# Patient Record
Sex: Male | Born: 1970 | Race: White | Hispanic: No | Smoking: Current every day smoker
Health system: Southern US, Community
[De-identification: ages and names within clinical notes are randomized; demographics above are authoritative.]

## PROBLEM LIST (undated history)

## (undated) DIAGNOSIS — W3301XA Accidental discharge of shotgun, initial encounter: Secondary | ICD-10-CM

## (undated) DIAGNOSIS — I219 Acute myocardial infarction, unspecified: Secondary | ICD-10-CM

## (undated) DIAGNOSIS — T148XXA Other injury of unspecified body region, initial encounter: Secondary | ICD-10-CM

## (undated) DIAGNOSIS — E669 Obesity, unspecified: Secondary | ICD-10-CM

## (undated) DIAGNOSIS — I2510 Atherosclerotic heart disease of native coronary artery without angina pectoris: Secondary | ICD-10-CM

## (undated) DIAGNOSIS — F1010 Alcohol abuse, uncomplicated: Secondary | ICD-10-CM

## (undated) HISTORY — DX: Alcohol abuse, uncomplicated: F10.10

## (undated) HISTORY — PX: CORONARY STENT PLACEMENT: SHX1402

## (undated) HISTORY — DX: Obesity, unspecified: E66.9

## (undated) HISTORY — DX: Other injury of unspecified body region, initial encounter: T14.8XXA

## (undated) HISTORY — DX: Hypomagnesemia: E83.42

## (undated) HISTORY — DX: Accidental discharge of shotgun, initial encounter: W33.01XA

---

## 2005-02-18 ENCOUNTER — Emergency Department: Payer: Self-pay | Admitting: General Practice

## 2007-10-16 ENCOUNTER — Emergency Department: Payer: Self-pay | Admitting: Emergency Medicine

## 2008-09-09 ENCOUNTER — Emergency Department: Payer: Self-pay | Admitting: Emergency Medicine

## 2009-01-03 ENCOUNTER — Emergency Department: Payer: Self-pay | Admitting: Unknown Physician Specialty

## 2009-01-04 ENCOUNTER — Encounter: Payer: Self-pay | Admitting: Internal Medicine

## 2009-01-04 ENCOUNTER — Ambulatory Visit: Payer: Self-pay | Admitting: Internal Medicine

## 2009-01-04 DIAGNOSIS — F17200 Nicotine dependence, unspecified, uncomplicated: Secondary | ICD-10-CM

## 2009-01-04 DIAGNOSIS — R5381 Other malaise: Secondary | ICD-10-CM | POA: Insufficient documentation

## 2009-01-04 DIAGNOSIS — R5383 Other fatigue: Secondary | ICD-10-CM

## 2009-01-04 DIAGNOSIS — I4891 Unspecified atrial fibrillation: Secondary | ICD-10-CM

## 2009-01-04 DIAGNOSIS — F1010 Alcohol abuse, uncomplicated: Secondary | ICD-10-CM | POA: Insufficient documentation

## 2009-01-23 ENCOUNTER — Ambulatory Visit: Payer: Self-pay

## 2009-01-23 ENCOUNTER — Encounter: Payer: Self-pay | Admitting: Internal Medicine

## 2009-02-15 ENCOUNTER — Ambulatory Visit: Payer: Self-pay | Admitting: Orthopedic Surgery

## 2009-02-16 ENCOUNTER — Ambulatory Visit: Payer: Self-pay | Admitting: Internal Medicine

## 2009-02-20 ENCOUNTER — Inpatient Hospital Stay: Payer: Self-pay | Admitting: Orthopedic Surgery

## 2009-05-25 ENCOUNTER — Ambulatory Visit: Payer: Self-pay | Admitting: Internal Medicine

## 2009-07-27 ENCOUNTER — Ambulatory Visit: Payer: Self-pay | Admitting: Orthopedic Surgery

## 2009-08-02 ENCOUNTER — Ambulatory Visit: Payer: Self-pay | Admitting: Orthopedic Surgery

## 2009-11-22 ENCOUNTER — Ambulatory Visit: Payer: Self-pay | Admitting: Orthopedic Surgery

## 2010-02-24 ENCOUNTER — Emergency Department: Payer: Self-pay | Admitting: Emergency Medicine

## 2010-03-08 ENCOUNTER — Ambulatory Visit: Payer: Self-pay

## 2010-05-31 ENCOUNTER — Ambulatory Visit: Payer: Self-pay

## 2011-04-07 ENCOUNTER — Encounter: Payer: Self-pay | Admitting: Cardiovascular Disease

## 2011-11-12 ENCOUNTER — Inpatient Hospital Stay: Payer: Self-pay | Admitting: Internal Medicine

## 2011-11-12 LAB — COMPREHENSIVE METABOLIC PANEL
Albumin: 4.1 g/dL (ref 3.4–5.0)
Alkaline Phosphatase: 79 U/L (ref 50–136)
BUN: 7 mg/dL (ref 7–18)
EGFR (Non-African Amer.): >60
Glucose: 115 mg/dL — ABNORMAL HIGH (ref 65–99)
SGOT(AST): 83 U/L — ABNORMAL HIGH (ref 15–37)
SGPT (ALT): 105 U/L — ABNORMAL HIGH

## 2011-11-12 LAB — CK TOTAL AND CKMB (NOT AT ARMC)
CK, Total: 236 U/L — ABNORMAL HIGH (ref 35–232)
CK, Total: 688 U/L — ABNORMAL HIGH (ref 35–232)
CK-MB: 39.3 ng/mL — ABNORMAL HIGH (ref 0.5–3.6)

## 2011-11-12 LAB — CBC
HCT: 43.1 % (ref 40.0–52.0)
HGB: 14.6 g/dL (ref 13.0–18.0)
MCV: 95 fL (ref 80–100)
Platelet: 143 10*3/uL — ABNORMAL LOW (ref 150–440)
RDW: 13.5 % (ref 11.5–14.5)

## 2011-11-12 LAB — PROTIME-INR
INR: 1
Prothrombin Time: 13.2 secs (ref 11.5–14.7)

## 2011-11-12 LAB — TROPONIN I: Troponin-I: 18 ng/mL — ABNORMAL HIGH

## 2011-11-13 LAB — BASIC METABOLIC PANEL
Anion Gap: 14 (ref 7–16)
Calcium, Total: 8.1 mg/dL — ABNORMAL LOW (ref 8.5–10.1)
EGFR (African American): >60
EGFR (Non-African Amer.): >60
Glucose: 90 mg/dL (ref 65–99)
Osmolality: 281 (ref 275–301)

## 2011-11-13 LAB — TROPONIN I
Troponin-I: 13 ng/mL — ABNORMAL HIGH
Troponin-I: 6.8 ng/mL — ABNORMAL HIGH

## 2011-11-13 LAB — CK TOTAL AND CKMB (NOT AT ARMC)
CK, Total: 632 U/L — ABNORMAL HIGH (ref 35–232)
CK-MB: 31.8 ng/mL — ABNORMAL HIGH (ref 0.5–3.6)

## 2011-11-13 LAB — CK-MB: CK-MB: 20.6 ng/mL — ABNORMAL HIGH (ref 0.5–3.6)

## 2011-11-13 LAB — LIPID PANEL
HDL Cholesterol: 60 mg/dL (ref 40–60)
Ldl Cholesterol, Calc: 74 mg/dL (ref 0–100)
Triglycerides: 67 mg/dL (ref 0–200)
VLDL Cholesterol, Calc: 13 mg/dL (ref 5–40)

## 2011-12-01 ENCOUNTER — Ambulatory Visit: Payer: Self-pay | Admitting: Internal Medicine

## 2012-02-16 ENCOUNTER — Encounter: Payer: Self-pay | Admitting: Internal Medicine

## 2012-02-18 ENCOUNTER — Encounter: Payer: Self-pay | Admitting: Internal Medicine

## 2012-03-20 ENCOUNTER — Encounter: Payer: Self-pay | Admitting: Internal Medicine

## 2012-12-30 ENCOUNTER — Emergency Department: Payer: Self-pay | Admitting: Emergency Medicine

## 2012-12-30 LAB — URINALYSIS, COMPLETE
Bacteria: NONE SEEN
Blood: NEGATIVE
Glucose,UR: NEGATIVE mg/dL (ref 0–75)
Leukocyte Esterase: NEGATIVE
Nitrite: NEGATIVE
Ph: 7 (ref 4.5–8.0)
Protein: 30
RBC,UR: <1 /HPF (ref 0–5)
Squamous Epithelial: NONE SEEN

## 2012-12-30 LAB — CBC
HCT: 43.6 % (ref 40.0–52.0)
MCH: 31.8 pg (ref 26.0–34.0)
MCV: 92 fL (ref 80–100)
Platelet: 151 10*3/uL (ref 150–440)
RBC: 4.77 10*6/uL (ref 4.40–5.90)
RDW: 13.8 % (ref 11.5–14.5)
WBC: 8.9 10*3/uL (ref 3.8–10.6)

## 2012-12-30 LAB — DRUG SCREEN, URINE
Benzodiazepine, Ur Scrn: POSITIVE (ref ?–200)
Cannabinoid 50 Ng, Ur ~~LOC~~: POSITIVE (ref ?–50)
Methadone, Ur Screen: NEGATIVE (ref ?–300)
Opiate, Ur Screen: NEGATIVE (ref ?–300)
Phencyclidine (PCP) Ur S: NEGATIVE (ref ?–25)

## 2012-12-30 LAB — COMPREHENSIVE METABOLIC PANEL
Albumin: 4.3 g/dL (ref 3.4–5.0)
Bilirubin,Total: 0.6 mg/dL (ref 0.2–1.0)
Calcium, Total: 8.5 mg/dL (ref 8.5–10.1)
Co2: 25 mmol/L (ref 21–32)
Creatinine: 1.08 mg/dL (ref 0.60–1.30)
Osmolality: 270 (ref 275–301)
SGOT(AST): 50 U/L — ABNORMAL HIGH (ref 15–37)
Total Protein: 8.4 g/dL — ABNORMAL HIGH (ref 6.4–8.2)

## 2012-12-30 LAB — TSH: Thyroid Stimulating Horm: 2.25 u[IU]/mL

## 2013-03-22 ENCOUNTER — Emergency Department: Payer: Self-pay | Admitting: Internal Medicine

## 2013-12-19 ENCOUNTER — Emergency Department: Payer: Self-pay | Admitting: Emergency Medicine
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
## 2015-02-11 NOTE — Consult Note (Signed)
PATIENT NAME:  Willie Lowe, Willie Lowe MR#:  [id number] DATE OF BIRTH:  01-15-1971  DATE OF CONSULTATION:  11/12/2011  REFERRING PHYSICIAN:  Dorothyann Pengwayne Callwood, MD (Cardiologist) CONSULTING PHYSICIAN:  Darrick MeigsSangeeta Kyli Sorter, MD  PRIMARY CARE PHYSICIAN: None.  REASON FOR CONSULTATION: Medical management.   HISTORY OF PRESENT ILLNESS: The patient is a 44 year old male with past medical history of extensive smoking and alcohol use who presented with nausea, vomiting, shortness of breath, diaphoresis, and chest tightness. He was found to have NSTEMI and was taken to the Cath Lab by Dr. Juliann Paresallwood. The patient was found to have 95% blockage in RCA and LAD, which was stented. The patient is currently in the Intensive Care Unit and chest pain free. He is feeling better. Medicine consult was obtained for medical management.   ALLERGIES: No known drug allergies.   PAST MEDICAL HISTORY: None.   PAST SURGICAL HISTORY:  1. Left thumb surgery.  2. Right humeral fracture ORIF.   MEDICATIONS: None.   FAMILY HISTORY: The patient denies any family history of coronary artery disease, hypertension, or diabetes.   SOCIAL HISTORY: The patient drinks about 2 pints of hard liquor/whiskey every other day. He smokes 1-1/2 packs per day. He lives with his brother. He works in [employer].  REVIEW OF SYSTEMS:  Initially the patient presented with nausea, vomiting, diaphoresis, and chest tightness. Currently he denies any complaints. CONSTITUTIONAL: Denies any fever, weakness, or fatigue. EYES: Denies any blurred or double vision. ENT: Denies any tinnitus or ear pain. RESPIRATORY: Denies any wheezing, cough, or seizures. Denies any chest pain, tachycardia, or palpitations at present. GASTROINTESTINAL: Denies any nausea, vomiting, diarrhea, or abdominal pain. GENITOURINARY: Denies any nocturia or pyuria. MUSCULOSKELETAL: Denies any joint pain or stiffness. INTEGUMENTARY: Denies any rashes or eruptions. NEUROLOGICAL: Denies any fainting  spells, blackouts, or seizures. PSYCH: Denies any insomnia, headache or depression. ENDOCRINE: Denies any thyroid problems, heat or cold intolerance. HEME/LYMPH: Denies any anemia or easy bruisability.   PHYSICAL EXAMINATION:   VITAL SIGNS: Temperature 97.4, heart rate 86, respiratory rate 20, blood pressure 148/87, and pulse oximetry 100%.   GENERAL: The patient is a well-built Caucasian male lying comfortably in bed, not in acute distress.   HEAD: Atraumatic, normocephalic.   EYES: No pallor, icterus, or cyanosis. Pupils are equally round and reactive to light and accommodation. Extraocular movements are intact.   ENT: Wet mucous membranes. No oropharyngeal erythema or thrush.   NECK: Supple. No masses. No JVD. No thyromegaly or lymphadenopathy.   CHEST WALL: No tenderness to palpation. No intercostal muscle retractions. Not using accessory muscles of respiration.   LUNGS: Bilaterally clear to auscultation. No wheezing, rales, or rhonchi.   HEART:  S1 and S2 regular. No murmurs, rubs, or gallops.   ABDOMEN: Soft, nontender, and nondistended. No guarding or rigidity. Normal bowel sounds.   SKIN: No rashes or lesions.   PERIPHERIES: No pedal edema. 2+ pedal pulses.   MUSCULOSKELETAL: No cyanosis or clubbing.   NEUROLOGIC: Awake, alert, and oriented x3. Nonfocal neurological exam. Cranial nerves grossly intact.   PSYCH: Normal mood and affect.   RESULTS: CK 236. Troponin less than 0.02. INR 1.0. PTT 33.4. White count 6.7, hemoglobin 14.6, hematocrit 43.1, platelets 143. Glucose 115, BUN 7, creatinine 0.92, sodium 139, potassium 3.5, chloride 105, CO2 25, ALT 105, AST 83.   ASSESSMENT AND PLAN: This is a 44 year old male with no significant past medical history other than history of extensive alcohol and smoking who presents with chest pain.  1. STEMI: The patient  is status post stents to LAD and RCA. He is currently on Integrilin drip, aspirin, and Plavix. We will add low-dose  beta blocker and ACE inhibitor. We will check a fasting lipid profile. We will hold statin for now in view of alcohol-induced hepatitis.  2. Thrombocytopenia and elevated LFTs, possibly due to extensive alcohol use. 3. Hyperglycemia, possibly reactive: The patient has no history of diabetes.  4. History of extensive alcohol and smoking: The patient was counseled for more than 10 minutes. I would continue CIWA protocol for the time being and the patient is provided with a nicotine patch.  5. Indigestion: The patient is reporting some indigestion. We will start on Maalox. 6. We will place on GI prophylaxis.  I reviewed all medical records and discussed with the patient the plan of care and management.   TIME SPENT: 75 minutes.  ____________________________ Darrick Meigs, MD sp:slb D: 11/12/2011 20:36:43 ET T: 11/13/2011 09:43:11 ET JOB#: [id number]  cc: Darrick Meigs, MD, <Dictator> Darrick Meigs MD ELECTRONICALLY SIGNED 11/14/2011 20:44

## 2015-02-25 ENCOUNTER — Other Ambulatory Visit: Payer: Self-pay

## 2015-02-25 ENCOUNTER — Emergency Department: Payer: Medicaid Other

## 2015-02-25 ENCOUNTER — Emergency Department
Admission: EM | Admit: 2015-02-25 | Discharge: 2015-02-25 | Discharge status: Against Medical Advice | Payer: Medicaid Other | Attending: Emergency Medicine | Admitting: Emergency Medicine

## 2015-02-25 DIAGNOSIS — Z79899 Other long term (current) drug therapy: Secondary | ICD-10-CM | POA: Diagnosis not present

## 2015-02-25 DIAGNOSIS — Z7982 Long term (current) use of aspirin: Secondary | ICD-10-CM | POA: Diagnosis not present

## 2015-02-25 DIAGNOSIS — Z720 Tobacco use: Secondary | ICD-10-CM | POA: Diagnosis not present

## 2015-02-25 DIAGNOSIS — R079 Chest pain, unspecified: Secondary | ICD-10-CM | POA: Diagnosis not present

## 2015-02-25 LAB — CBC
HCT: 40.4 % (ref 40.0–52.0)
Hemoglobin: 13.5 g/dL (ref 13.0–18.0)
MCH: 32.3 pg (ref 26.0–34.0)
MCHC: 33.4 g/dL (ref 32.0–36.0)
MCV: 96.9 fL (ref 80.0–100.0)
PLATELETS: 138 10*3/uL — AB (ref 150–440)
RBC: 4.17 MIL/uL — ABNORMAL LOW (ref 4.40–5.90)
RDW: 14.1 % (ref 11.5–14.5)
WBC: 3.8 10*3/uL (ref 3.8–10.6)

## 2015-02-25 LAB — BASIC METABOLIC PANEL
ANION GAP: 12 (ref 5–15)
BUN: 12 mg/dL (ref 6–20)
CHLORIDE: 104 mmol/L (ref 101–111)
CO2: 25 mmol/L (ref 22–32)
Calcium: 8.7 mg/dL — ABNORMAL LOW (ref 8.9–10.3)
Creatinine, Ser: 0.63 mg/dL (ref 0.61–1.24)
GFR calc Af Amer: >60 mL/min (ref >60)
GFR calc non Af Amer: >60 mL/min (ref >60)
Glucose, Bld: 93 mg/dL (ref 65–99)
Potassium: 3.8 mmol/L (ref 3.5–5.1)
Sodium: 141 mmol/L (ref 135–145)

## 2015-02-25 LAB — PROTIME-INR
INR: 0.94
Prothrombin Time: 12.8 seconds (ref 11.4–15.0)

## 2015-02-25 LAB — TROPONIN I

## 2015-02-25 NOTE — Discharge Instructions (Signed)
Chest Pain (Nonspecific)  Follow up at Dr. Juliann Paresallwood tomorrow at 10:45 AM. We have made an appointment for Willie Lowe and expect to see there for further evaluation of her chest pain.  As we discussed I highly recommend he stay in the hospital for further evaluation to make sure not having a heart attack, but she had indicated that he cannot stay today.  Return to the ER right away should you pass out, stopped breathing, develop recurrent chest pain, difficulty breathing, have heavy sweats, nausea vomiting, or any other new concerns or symptoms arise.  Should you decide to return for further follow-up, you may do so to the ER anytime.  It is often hard to give a specific diagnosis for the cause of chest pain. There is always a chance that your pain could be related to something serious, such as a heart attack or a blood clot in the lungs. You need to follow up with your health care provider for further evaluation. CAUSES   Heartburn.  Pneumonia or bronchitis.  Anxiety or stress.  Inflammation around your heart (pericarditis) or lung (pleuritis or pleurisy).  A blood clot in the lung.  A collapsed lung (pneumothorax). It can develop suddenly on its own (spontaneous pneumothorax) or from trauma to the chest.  Shingles infection (herpes zoster virus). The chest wall is composed of bones, muscles, and cartilage. Any of these can be the source of the pain.  The bones can be bruised by injury.  The muscles or cartilage can be strained by coughing or overwork.  The cartilage can be affected by inflammation and become sore (costochondritis). DIAGNOSIS  Lab tests or other studies may be needed to find the cause of your pain. Your health care provider may have you take a test called an ambulatory electrocardiogram (ECG). An ECG records your heartbeat patterns over a 24-hour period. You may also have other tests, such as:  Transthoracic echocardiogram (TTE). During echocardiography, sound waves are  used to evaluate how blood flows through your heart.  Transesophageal echocardiogram (TEE).  Cardiac monitoring. This allows your health care provider to monitor your heart rate and rhythm in real time.  Holter monitor. This is a portable device that records your heartbeat and can help diagnose heart arrhythmias. It allows your health care provider to track your heart activity for several days, if needed.  Stress tests by exercise or by giving medicine that makes the heart beat faster. TREATMENT   Treatment depends on what may be causing your chest pain. Treatment may include:  Acid blockers for heartburn.  Anti-inflammatory medicine.  Pain medicine for inflammatory conditions.  Antibiotics if an infection is present.  You may be advised to change lifestyle habits. This includes stopping smoking and avoiding alcohol, caffeine, and chocolate.  You may be advised to keep your head raised (elevated) when sleeping. This reduces the chance of acid going backward from your stomach into your esophagus. Most of the time, nonspecific chest pain will improve within 2-3 days with rest and mild pain medicine.  HOME CARE INSTRUCTIONS   If antibiotics were prescribed, take them as directed. Finish them even if you start to feel better.  For the next few days, avoid physical activities that bring on chest pain. Continue physical activities as directed.  Do not use any tobacco products, including cigarettes, chewing tobacco, or electronic cigarettes.  Avoid drinking alcohol.  Only take medicine as directed by your health care provider.  Follow your health care provider's suggestions for further testing if  your chest pain does not go away.  Keep any follow-up appointments you made. If you do not go to an appointment, you could develop lasting (chronic) problems with pain. If there is any problem keeping an appointment, call to reschedule. SEEK MEDICAL CARE IF:   Your chest pain does not go  away, even after treatment.  You have a rash with blisters on your chest.  You have a fever. SEEK IMMEDIATE MEDICAL CARE IF:   You have increased chest pain or pain that spreads to your arm, neck, jaw, back, or abdomen.  You have shortness of breath.  You have an increasing cough, or you cough up blood.  You have severe back or abdominal pain.  You feel nauseous or vomit.  You have severe weakness.  You faint.  You have chills. This is an emergency. Do not wait to see if the pain will go away. Get medical help at once. Call your local emergency services (911 in [place] Do not drive yourself to the hospital. MAKE SURE YOU:   Understand these instructions.  Will watch your condition.  Will get help right away if you are not doing well or get worse. Document Released: 07/16/2005 Document Revised: 10/11/2013 Document Reviewed: 05/11/2008 [facility] Patient Information 2015 [place], [place]. This information is not intended to replace advice given to you by your health care provider. Make sure you discuss any questions you have with your health care provider.

## 2015-02-25 NOTE — ED Notes (Signed)
Pt from home via EMS c/o chest pain x4 days. Hx previous MI with stent placement per report. Pt states pain is deep radiating into back and left arm. Worse with deep breath. States he had a burst of energy at time of arrival and could "run a marathon". Nitro spray given PTA by EMS. Pt reports taking 3 81mg  ASA prior to calling EMS. + nausea.

## 2015-02-25 NOTE — ED Notes (Signed)
Pt left AMA prior to signing.

## 2015-02-25 NOTE — ED Notes (Signed)
Pt states he wants to leave and fells great. Quale MD notified. Pt advised all results are not back and would be best if stayed to results are finished. Family at bedside.
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
## 2016-01-17 ENCOUNTER — Encounter: Payer: Self-pay | Admitting: Emergency Medicine

## 2016-01-17 ENCOUNTER — Emergency Department: Payer: Medicaid Other

## 2016-01-17 ENCOUNTER — Emergency Department
Admission: EM | Admit: 2016-01-17 | Discharge: 2016-01-17 | Disposition: A | Discharge status: Home / Self Care | Payer: Medicaid Other | Attending: Emergency Medicine | Admitting: Emergency Medicine

## 2016-01-17 DIAGNOSIS — S82831A Other fracture of upper and lower end of right fibula, initial encounter for closed fracture: Secondary | ICD-10-CM | POA: Insufficient documentation

## 2016-01-17 DIAGNOSIS — Z79899 Other long term (current) drug therapy: Secondary | ICD-10-CM | POA: Diagnosis not present

## 2016-01-17 DIAGNOSIS — Z7982 Long term (current) use of aspirin: Secondary | ICD-10-CM | POA: Insufficient documentation

## 2016-01-17 DIAGNOSIS — W108XXA Fall (on) (from) other stairs and steps, initial encounter: Secondary | ICD-10-CM | POA: Insufficient documentation

## 2016-01-17 DIAGNOSIS — Y9389 Activity, other specified: Secondary | ICD-10-CM | POA: Insufficient documentation

## 2016-01-17 DIAGNOSIS — S9001XA Contusion of right ankle, initial encounter: Secondary | ICD-10-CM | POA: Insufficient documentation

## 2016-01-17 DIAGNOSIS — Y998 Other external cause status: Secondary | ICD-10-CM | POA: Diagnosis not present

## 2016-01-17 DIAGNOSIS — Y9289 Other specified places as the place of occurrence of the external cause: Secondary | ICD-10-CM | POA: Diagnosis not present

## 2016-01-17 DIAGNOSIS — F17200 Nicotine dependence, unspecified, uncomplicated: Secondary | ICD-10-CM | POA: Diagnosis not present

## 2016-01-17 DIAGNOSIS — S99911A Unspecified injury of right ankle, initial encounter: Secondary | ICD-10-CM | POA: Diagnosis present

## 2016-01-17 DIAGNOSIS — S82401A Unspecified fracture of shaft of right fibula, initial encounter for closed fracture: Secondary | ICD-10-CM

## 2016-01-17 HISTORY — DX: Acute myocardial infarction, unspecified: I21.9

## 2016-01-17 MED ORDER — OXYCODONE-ACETAMINOPHEN 5-325 MG PO TABS: 1.0000 | ORAL_TABLET | ORAL | Status: AC | PRN

## 2016-01-17 MED ORDER — MORPHINE SULFATE (PF) 2 MG/ML IV SOLN
2.0000 mg | Freq: Once | INTRAVENOUS | Status: AC
  Administered 2016-01-17: 2 mg via INTRAMUSCULAR
  Filled 2016-01-17: qty 1

## 2016-01-17 NOTE — ED Notes (Signed)
Patient with no complaints at this time. Respirations even and unlabored. Skin warm/dry. Discharge instructions reviewed with patient at this time. Patient given opportunity to voice concerns/ask questions. Patient discharged at this time and left Emergency Department, via wheelchair.   
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
## 2016-01-17 NOTE — ED Notes (Signed)
Pt presents to ED via [facility] from personal home with c/o of mechanical fall. EMS states pt experienced presenting sx since 1800 last evening. EMS states pt "tripped over steps outside". Noted area moderately swollen, ice pack in place by EMS. Sensory to affected extremity not intact. Pt able to move lower extremity with active ROM. Capillary refill WNL. Pt arrived alert and oriented x4.

## 2016-01-17 NOTE — Discharge Instructions (Signed)
Fibular Ankle Fracture Treated With or Without Immobilization, Adult °A fibular fracture at your ankle is a break (fracture) bone in the smallest of the two bones in your lower leg, located on the outside of your leg (fibula) close to the area at your ankle joint. °CAUSES °· Rolling your ankle. °· Twisting your ankle. °· Extreme flexing or extending of your foot. °· Severe force on your ankle as when falling from a distance. °RISK FACTORS °· Jumping activities. °· Participation in sports. °· Osteoporosis. °· Advanced age. °· Previous ankle injuries. °SIGNS AND SYMPTOMS °· Pain. °· Swelling. °· Inability to put weight on injured ankle. °· Bruising. °· Bone deformities at site of injury. °DIAGNOSIS  °This fracture is diagnosed with the help of an X-ray exam. °TREATMENT  °If the fractured bone did not move out of place it usually will heal without problems and does casting or splinting. If immobilization is needed for comfort or the fractured bone moved out of place and will not heal properly with immobilization, a cast or splint will be used. °HOME CARE INSTRUCTIONS  °· Apply ice to the area of injury: °¨ Put ice in a plastic bag. °¨ Place a towel between your skin and the bag. °¨ Leave the ice on for 20 minutes, 2-3 times a day. °· Use crutches as directed. Resume walking without crutches as directed by your health care provider. °· Only take over-the-counter or prescription medicines for pain, discomfort, or fever as directed by your health care provider. °· If you have a removable splint or boot, do not remove the boot unless directed by your health care provider. °SEEK MEDICAL CARE IF:  °· You have continued pain or more swelling °· The medications do not control the pain. °SEEK IMMEDIATE MEDICAL CARE IF: °· You develop severe pain in the leg or foot. °· Your skin or nails below the injury turn blue or grey or feel cold or numb. °MAKE SURE YOU:  °· Understand these instructions. °· Will watch your  condition. °· Will get help right away if you are not doing well or get worse. °  °This information is not intended to replace advice given to you by your health care provider. Make sure you discuss any questions you have with your health care provider. °  °Document Released: 10/06/2005 Document Revised: 10/27/2014 Document Reviewed: 05/18/2013 °[employer]vier Interactive Patient Education ©2016 [employer] ° °

## 2016-10-17 IMAGING — CR DG FOOT COMPLETE 3+V*R*
1 series · 3 of 3 positions shown · non-contrast
Comparison: None.

CLINICAL DATA: 44-year-old male with fall and right ankle pain.

EXAM:
RIGHT FOOT COMPLETE - 3+ VIEW; RIGHT ANKLE - COMPLETE 3+ VIEW

[Series 1: dg foot complete right · 0.14mm/px · 3 of 3 slices shown]
[im 1/3]
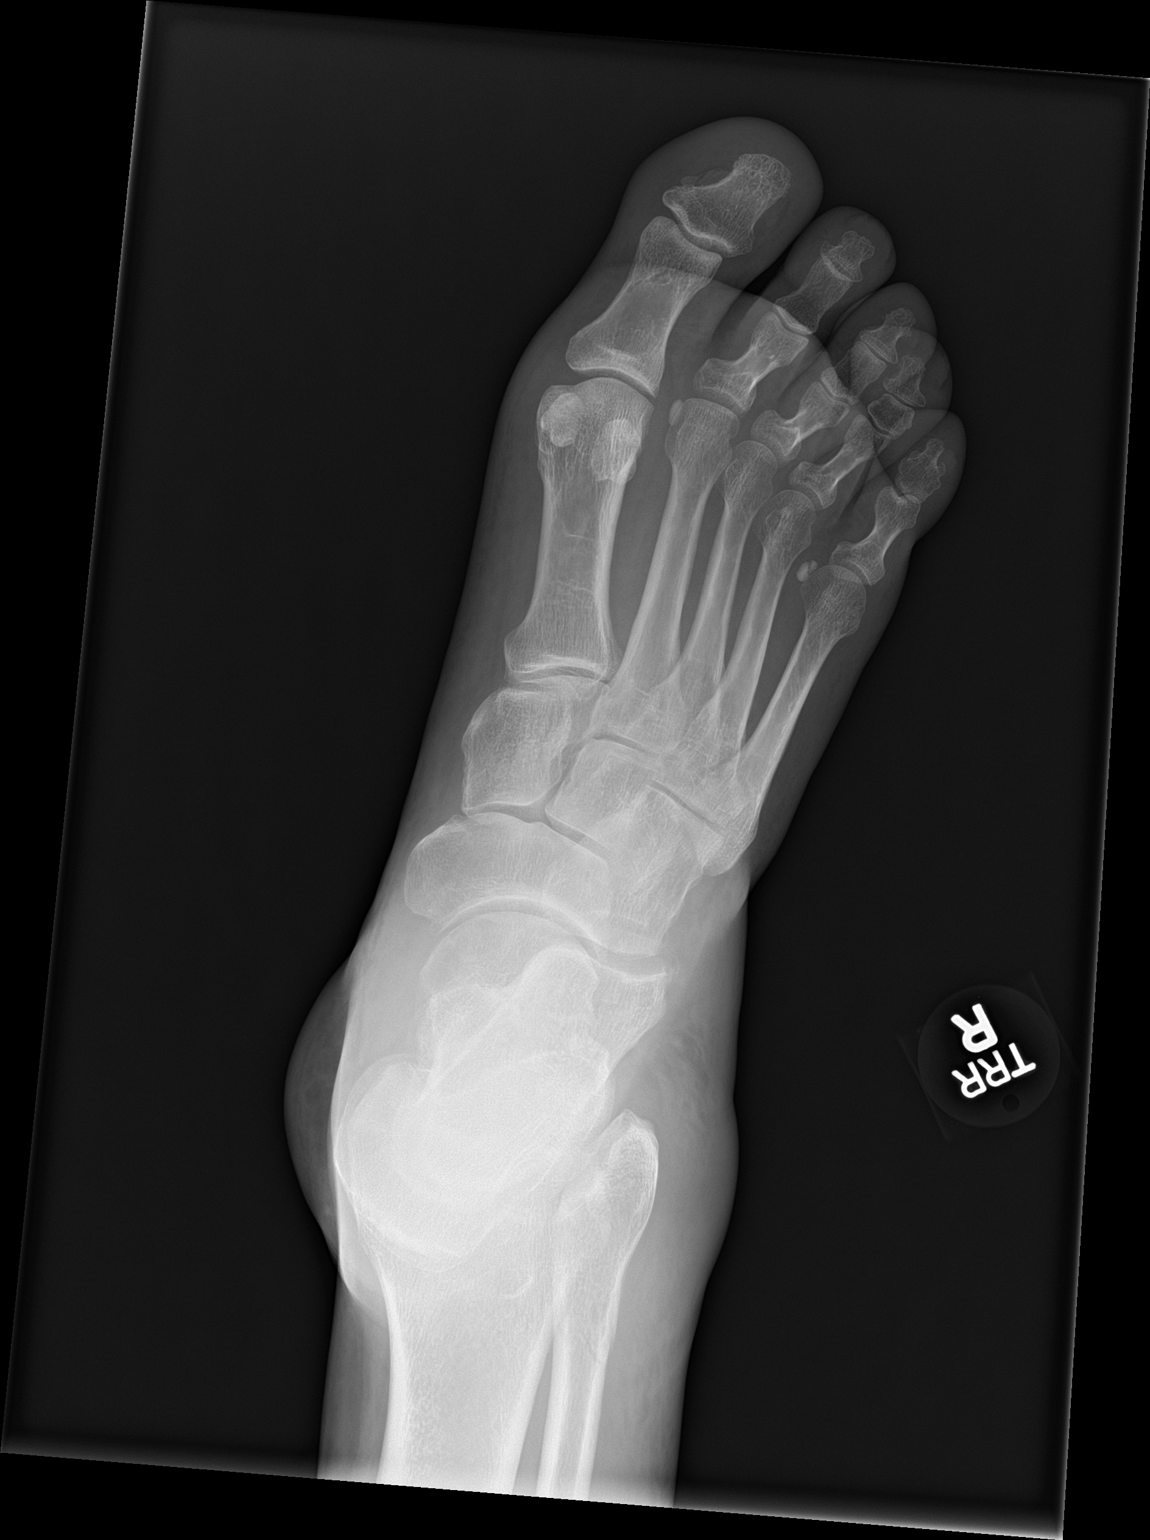
[im 2/3]
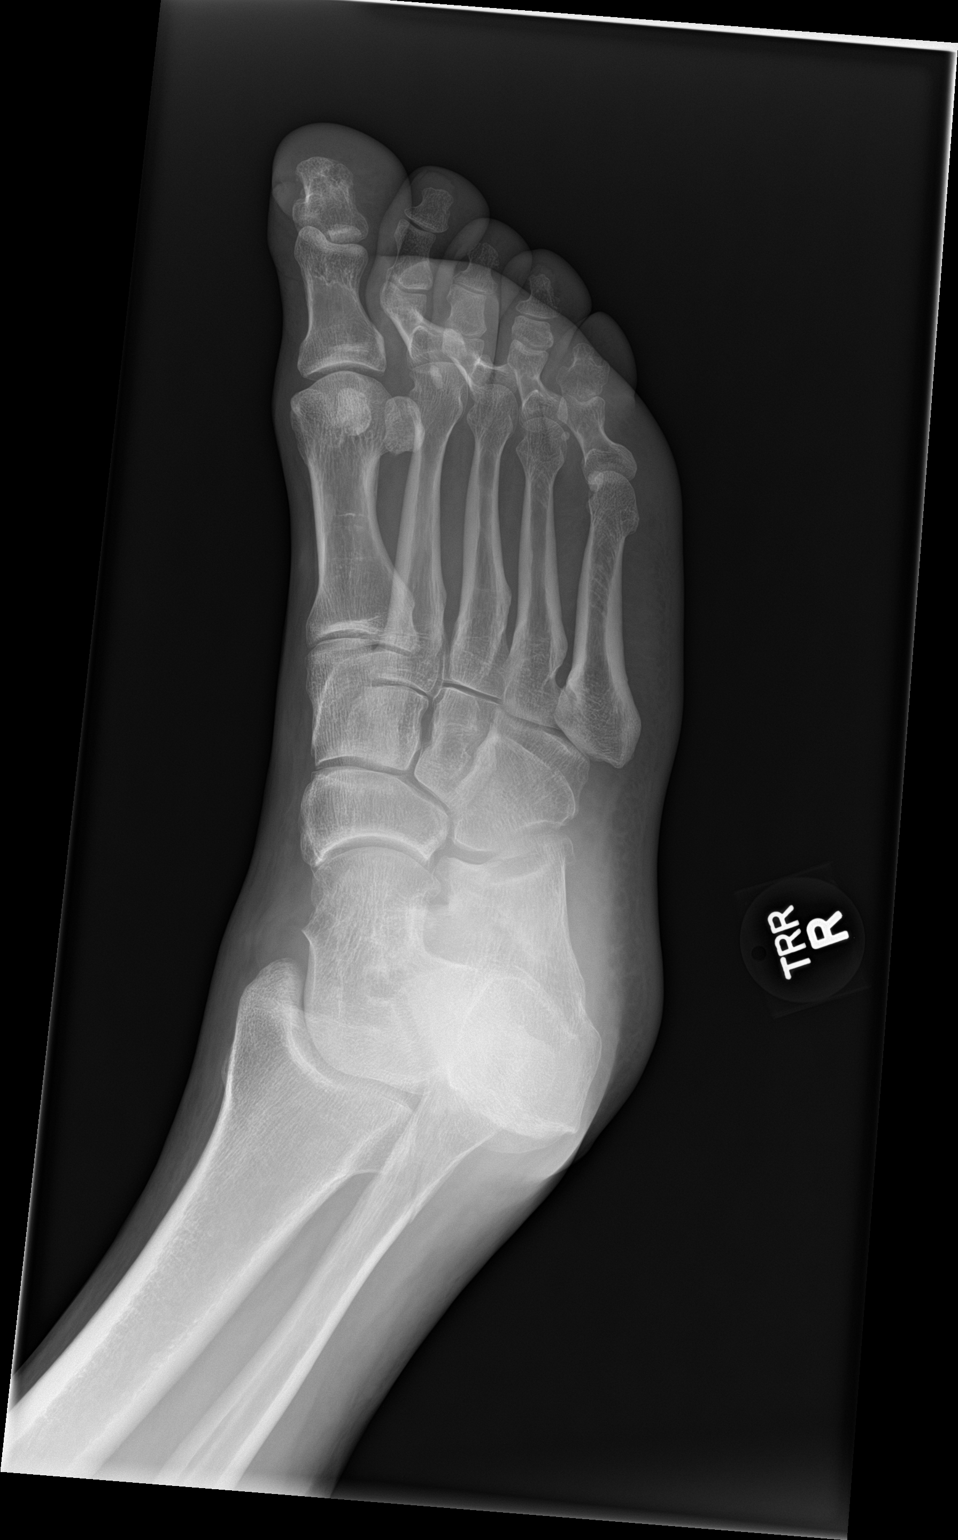
[im 3/3]
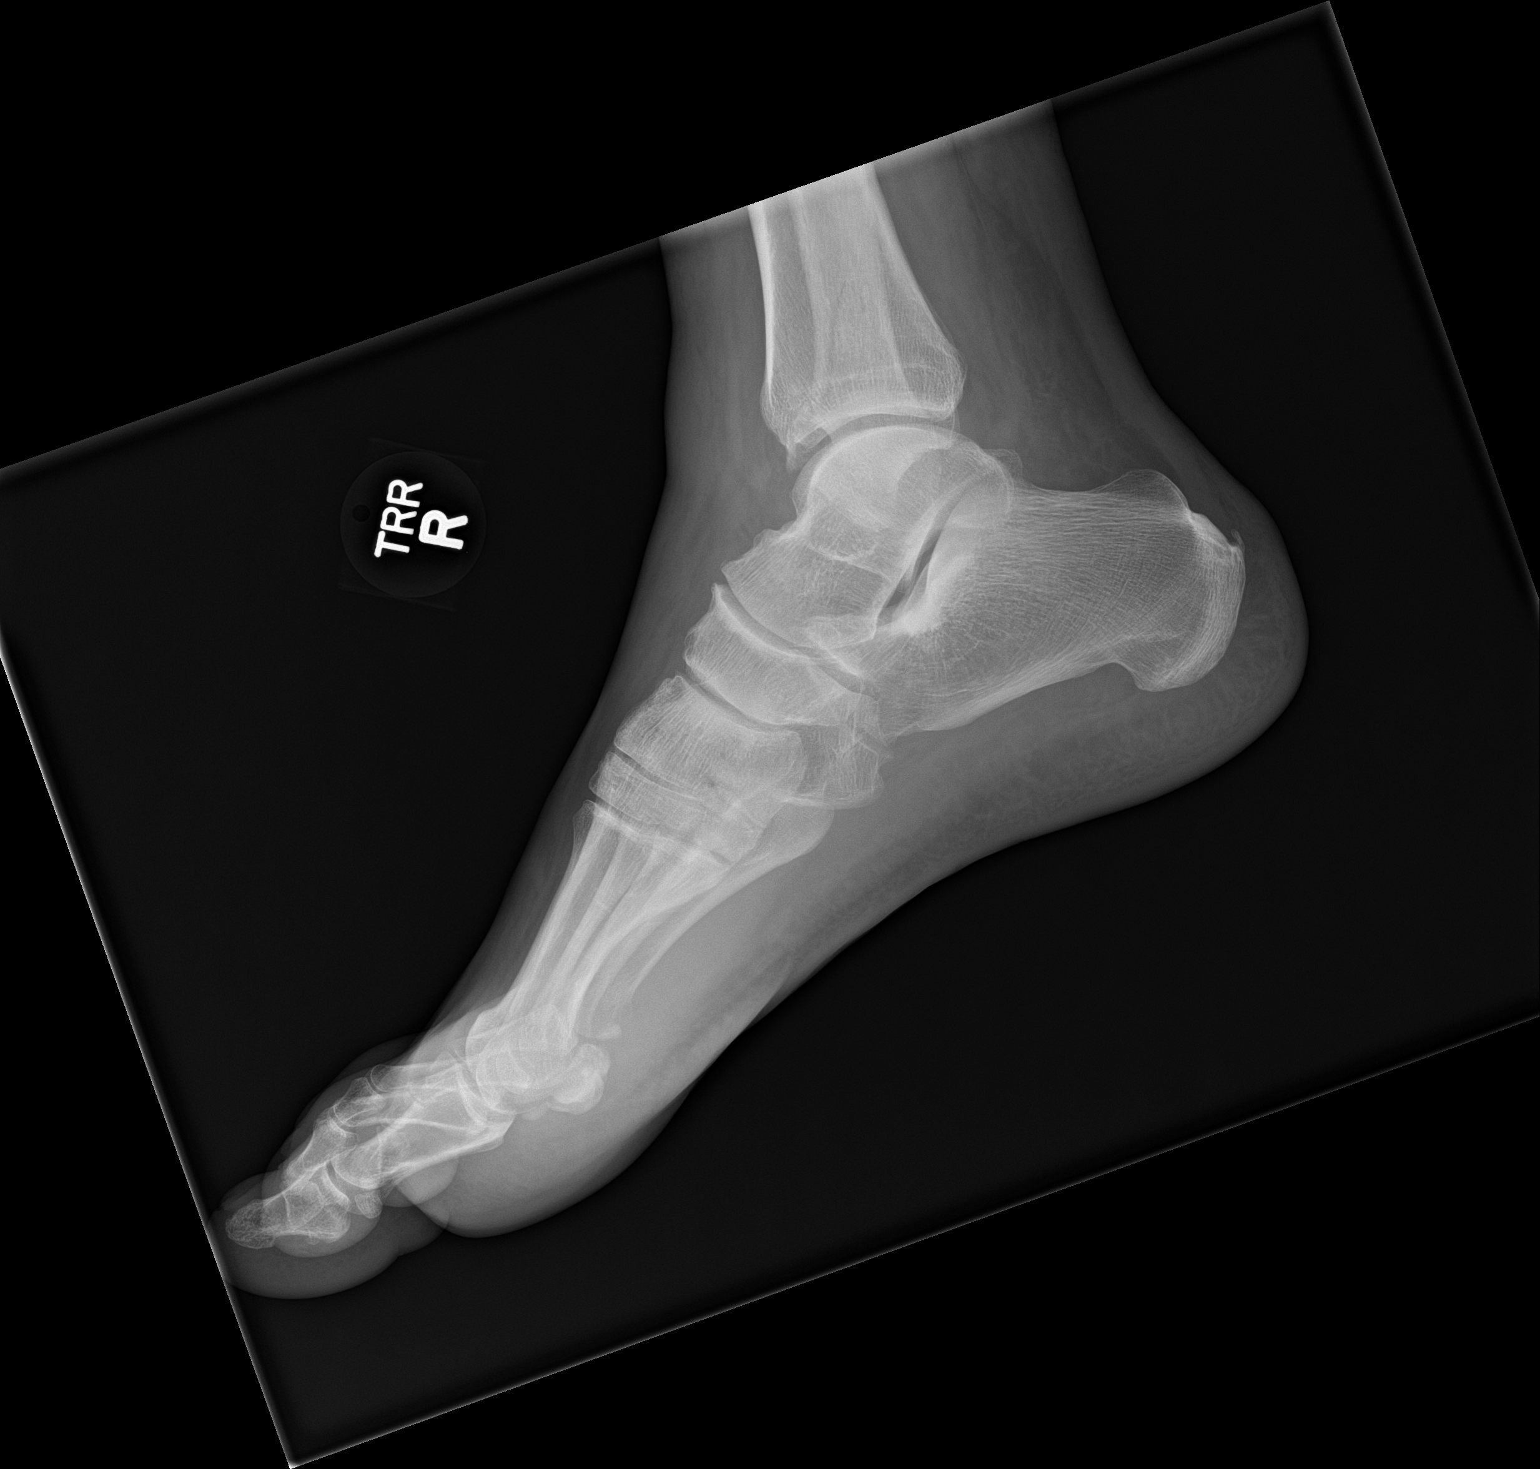

[3 of 3 positions shown; findings below may reference images not displayed]

FINDINGS: There is a nondisplaced oblique fracture of the distal fibula with
extension of the fracture line into the distal tibiofibular
syndesmosis no other acute fracture identified. The ankle mortise is
intact. The soft tissue swelling over the lateral malleolus.
IMPRESSION: Nondisplaced fracture of the distal fibula.

## 2023-02-19 ENCOUNTER — Emergency Department
Admission: EM | Admit: 2023-02-19 | Discharge: 2023-02-19 | Disposition: A | Discharge status: Home / Self Care | Payer: Self-pay | Attending: Emergency Medicine | Admitting: Emergency Medicine

## 2023-02-19 ENCOUNTER — Other Ambulatory Visit: Payer: Self-pay

## 2023-02-19 ENCOUNTER — Emergency Department: Payer: Self-pay

## 2023-02-19 DIAGNOSIS — R112 Nausea with vomiting, unspecified: Secondary | ICD-10-CM | POA: Insufficient documentation

## 2023-02-19 DIAGNOSIS — I10 Essential (primary) hypertension: Secondary | ICD-10-CM | POA: Insufficient documentation

## 2023-02-19 DIAGNOSIS — Z7901 Long term (current) use of anticoagulants: Secondary | ICD-10-CM | POA: Insufficient documentation

## 2023-02-19 DIAGNOSIS — J449 Chronic obstructive pulmonary disease, unspecified: Secondary | ICD-10-CM | POA: Insufficient documentation

## 2023-02-19 DIAGNOSIS — R0602 Shortness of breath: Secondary | ICD-10-CM | POA: Insufficient documentation

## 2023-02-19 DIAGNOSIS — F17200 Nicotine dependence, unspecified, uncomplicated: Secondary | ICD-10-CM | POA: Insufficient documentation

## 2023-02-19 DIAGNOSIS — R079 Chest pain, unspecified: Secondary | ICD-10-CM | POA: Insufficient documentation

## 2023-02-19 HISTORY — DX: Atherosclerotic heart disease of native coronary artery without angina pectoris: I25.10

## 2023-02-19 LAB — BASIC METABOLIC PANEL
Anion gap: 8 (ref 5–15)
BUN: 20 mg/dL (ref 6–20)
CO2: 23 mmol/L (ref 22–32)
Calcium: 9.2 mg/dL (ref 8.9–10.3)
Chloride: 103 mmol/L (ref 98–111)
Creatinine, Ser: 0.89 mg/dL (ref 0.61–1.24)
GFR, Estimated: >60 mL/min (ref >60)
Glucose, Bld: 105 mg/dL — ABNORMAL HIGH (ref 70–99)
Potassium: 4.5 mmol/L (ref 3.5–5.1)
Sodium: 134 mmol/L — ABNORMAL LOW (ref 135–145)

## 2023-02-19 LAB — CBC
HCT: 43.7 % (ref 39.0–52.0)
Hemoglobin: 14.7 g/dL (ref 13.0–17.0)
MCH: 30 pg (ref 26.0–34.0)
MCHC: 33.6 g/dL (ref 30.0–36.0)
MCV: 89.2 fL (ref 80.0–100.0)
Platelets: 152 10*3/uL (ref 150–400)
RBC: 4.9 MIL/uL (ref 4.22–5.81)
RDW: 12.4 % (ref 11.5–15.5)
WBC: 6.1 10*3/uL (ref 4.0–10.5)
nRBC: 0 % (ref 0.0–0.2)

## 2023-02-19 LAB — TROPONIN I (HIGH SENSITIVITY)
Troponin I (High Sensitivity): 14 ng/L (ref <18)
Troponin I (High Sensitivity): 13 ng/L (ref <18)

## 2023-02-19 LAB — BRAIN NATRIURETIC PEPTIDE: B Natriuretic Peptide: 45.9 pg/mL (ref 0.0–100.0)

## 2023-02-19 NOTE — ED Provider Notes (Signed)
[facility] Provider Note    Event Date/Time   First MD Initiated Contact with Patient 02/19/23 1000     (approximate)   History   Chest Pain   HPI  Willie Lowe is a 52 y.o. male with no coronary disease, hypertension, hyperlipidemia, A-fib on Xarelto who comes in with concerns for chest pain.  Patient reports that he has had some intermittent chest pain over the past couple days.  He states that they seem to happen after he exerts himself.  He states is actually not as much chest pain more just like a restrictive feeling in his chest.  He states that it is associated with some sweating, shortness of breath, nausea, vomiting.  He states that after he rests it gets better.  He denies any fevers denies any pain currently.  He does report having prior 2 stents placed in his widow maker years ago.  States that he typically gets seen in [place] but is potentially recently relocating here.  He does report a lot of life stressors and does report smoking significantly with history of COPD on inhalers.  Physical Exam   Triage Vital Signs: ED Triage Vitals [02/19/23 0957]  Enc Vitals Group     BP 132/76     Pulse Rate 65     Resp 18     Temp 98.2 F (36.8 C)     Temp Source Oral     SpO2 99 %     Weight      Height      Head Circumference      Peak Flow      Pain Score 6     Pain Loc      Pain Edu?      Excl. in GC?     Most recent vital signs: Vitals:   02/19/23 0957  BP: 132/76  Pulse: 65  Resp: 18  Temp: 98.2 F (36.8 C)  SpO2: 99%     General: Awake, no distress.  CV:  Good peripheral perfusion.  Resp:  Normal effort.  No wheezing Abd:  No distention.  Soft nontender Other:  No swelling legs.  No calf tenderness   ED Results / Procedures / Treatments   Labs (all labs ordered are listed, but only abnormal results are displayed) Labs Reviewed  BASIC METABOLIC PANEL  CBC  TROPONIN I (HIGH SENSITIVITY)     EKG  My  interpretation of EKG:  Normal sinus rate 63 without any ST elevation or T wave inversions, normal QTc  RADIOLOGY I have reviewed the xray personally and interpreted and no evidence of any pneumonia, does have changes of emphysema PROCEDURES:  Critical Care performed: No  .1-3 Lead EKG Interpretation  Performed by: Concha Se, MD Authorized by: Concha Se, MD     Interpretation: normal     ECG rate:  60   ECG rate assessment: normal     Rhythm: sinus rhythm     Ectopy: none     Conduction: normal      MEDICATIONS ORDERED IN ED: Medications - No data to display   IMPRESSION / MDM / ASSESSMENT AND PLAN / ED COURSE  I reviewed the triage vital signs and the nursing notes.   Patient's presentation is most consistent with acute presentation with potential threat to life or bodily function.   Differential is ACS, stable angina, COPD.  Doubt PE given on blood thinner.  No evidence of significant CHF on examination.  Patient is hypertensive but not really on any blood pressure medicine other than metoprolol.  We discussed follow-up recheck for this given this could be related to his recent stress  Cardiac markers are negative x 2.  BMP reassuring CBC reassuring.  BNP normal  Discussed admission versus going home given patient's heart score of 4-patient would prefer to go home and follow-up outpatient.  He has had 2 negative cardiac markers but his story does sound concerning for stable angina.  I discussed the case with Dr. Juliann Pares from cardiology who offered to see the patient either today or tomorrow morning.  Patient stated that he could come back tomorrow morning.  He currently denies any chest pain, shortness of breath that is purely only when he exerts himself so we discussed holding off on any exertion until he gets seen by cardiology.  He expressed understanding.  He understands that if symptoms are changing or he develops symptoms at rest that he would need to return to  the ER immediately as I cannot predict future heart attacks.  We discussed the possibility this could definitely be related to his heart or could be related to his COPD and we recommended trying to cut down on smoking.   The patient is on the cardiac monitor to evaluate for evidence of arrhythmia and/or significant heart rate changes.      FINAL CLINICAL IMPRESSION(S) / ED DIAGNOSES   Final diagnoses:  Chest pain, unspecified type     Rx / DC Orders   ED Discharge Orders     None        Note:  This document was prepared using Dragon voice recognition software and may include unintentional dictation errors.   Concha Se, MD 02/19/23 1318

## 2023-02-19 NOTE — Discharge Instructions (Signed)
Your workup was reassuring there is no evidence of a heart attack today although your symptoms are concerning for the potential of some heart disease versus related to your COPD.  We discussed admission versus going home we feel comfortable going home and we have gotten you close follow-up with cardiology tomorrow at 8 AM.  You should return to the ER if you develop chest pain or shortness of breath at rest or any other concerns

## 2023-02-19 NOTE — ED Notes (Signed)
57 yom with an intial c/c of chest pain since Tuesday. The pt advised he is not having chest pain now. HX of previous MI. The pt is warm, pink, and dry. The pt is alert and oriented x 4.

## 2023-02-19 NOTE — ED Triage Notes (Signed)
Pt presents to ED with c/o of CP for the past 3 days. Pt states HX of MI. Pt also states N/V and SOB on Tuesday.
